# Patient Record
Sex: Male | Born: 1980 | Race: White | Hispanic: No | Marital: Married | Smoking: Current every day smoker
Health system: Southern US, Community
[De-identification: ages and names within clinical notes are randomized; demographics above are authoritative.]

---
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
## 2010-10-11 ENCOUNTER — Emergency Department (HOSPITAL_COMMUNITY): Payer: No Typology Code available for payment source

## 2010-10-11 ENCOUNTER — Emergency Department (HOSPITAL_COMMUNITY)
Admission: EM | Admit: 2010-10-11 | Discharge: 2010-10-11 | Disposition: A | Discharge status: Home / Self Care | Payer: No Typology Code available for payment source | Attending: Emergency Medicine | Admitting: Emergency Medicine

## 2010-10-11 DIAGNOSIS — F17200 Nicotine dependence, unspecified, uncomplicated: Secondary | ICD-10-CM | POA: Insufficient documentation

## 2010-10-11 DIAGNOSIS — Y92410 Unspecified street and highway as the place of occurrence of the external cause: Secondary | ICD-10-CM | POA: Insufficient documentation

## 2010-10-11 DIAGNOSIS — IMO0002 Reserved for concepts with insufficient information to code with codable children: Secondary | ICD-10-CM | POA: Insufficient documentation

## 2010-10-11 DIAGNOSIS — T07XXXA Unspecified multiple injuries, initial encounter: Secondary | ICD-10-CM

## 2010-10-11 LAB — BASIC METABOLIC PANEL
BUN: 6 mg/dL (ref 6–23)
Creatinine, Ser: 0.68 mg/dL (ref 0.50–1.35)
GFR calc non Af Amer: >60 mL/min (ref >60)
Glucose, Bld: 101 mg/dL — ABNORMAL HIGH (ref 70–99)
Potassium: 3.7 mEq/L (ref 3.5–5.1)

## 2010-10-11 LAB — CBC
HCT: 43.4 % (ref 39.0–52.0)
Hemoglobin: 14.7 g/dL (ref 13.0–17.0)
MCH: 28.6 pg (ref 26.0–34.0)
MCHC: 33.9 g/dL (ref 30.0–36.0)
MCV: 84.4 fL (ref 78.0–100.0)
RDW: 13.3 % (ref 11.5–15.5)

## 2010-10-11 MED ORDER — HYDROCODONE-ACETAMINOPHEN 5-325 MG PO TABS: 2.0000 | ORAL_TABLET | ORAL | Status: AC | PRN

## 2010-10-11 MED ORDER — HYDROMORPHONE HCL 1 MG/ML IJ SOLN
1.0000 mg | Freq: Once | INTRAMUSCULAR | Status: AC
  Administered 2010-10-11: 1 mg via INTRAVENOUS
  Filled 2010-10-11: qty 1

## 2010-10-11 MED ORDER — SODIUM CHLORIDE 0.9 % IV BOLUS (SEPSIS)
250.0000 mL | Freq: Once | INTRAVENOUS | Status: AC
  Administered 2010-10-11 (×2): 1000 mL via INTRAVENOUS

## 2010-10-11 MED ORDER — SODIUM CHLORIDE 0.9 % IV SOLN: INTRAVENOUS | Status: DC

## 2010-10-11 MED ORDER — IOHEXOL 300 MG/ML  SOLN
100.0000 mL | Freq: Once | INTRAMUSCULAR | Status: AC | PRN
  Administered 2010-10-11: 100 mL via INTRAVENOUS

## 2010-10-11 MED ORDER — IBUPROFEN 800 MG PO TABS: 800.0000 mg | ORAL_TABLET | Freq: Three times a day (TID) | ORAL | Status: AC

## 2010-10-11 NOTE — ED Notes (Signed)
Bedside report given to oncoming shift. Assuming care of pt. Denies any needs at this time. NAD. Notified MD will be in shortly to discuss test results and plan of care. Verbalized understanding. Call bell and family at [facility].

## 2010-10-11 NOTE — ED Notes (Signed)
edp in to eval 

## 2010-10-11 NOTE — ED Notes (Signed)
Pt return from ct. Waiting for result
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
## 2010-10-11 NOTE — ED Notes (Signed)
Patient is waiting for CT to be done. Patient also needs to use the restroom but will not use a urinal. Patient was told not to get up until after the CT scan was done.

## 2010-10-11 NOTE — ED Notes (Signed)
Pt waiting to be eval by edp and removed from [facility].

## 2018-09-19 ENCOUNTER — Other Ambulatory Visit: Payer: Self-pay

## 2018-09-19 ENCOUNTER — Other Ambulatory Visit: Payer: Self-pay | Admitting: Internal Medicine

## 2018-09-19 DIAGNOSIS — Z20822 Contact with and (suspected) exposure to covid-19: Secondary | ICD-10-CM

## 2018-09-25 LAB — NOVEL CORONAVIRUS, NAA: SARS-CoV-2, NAA: NOT DETECTED

## 2019-02-22 ENCOUNTER — Encounter (HOSPITAL_COMMUNITY): Payer: Self-pay | Admitting: Emergency Medicine

## 2019-02-22 ENCOUNTER — Emergency Department (HOSPITAL_COMMUNITY)
Admission: EM | Admit: 2019-02-22 | Discharge: 2019-02-22 | Disposition: A | Discharge status: Home / Self Care | Payer: BC Managed Care – PPO | Attending: Emergency Medicine | Admitting: Emergency Medicine

## 2019-02-22 ENCOUNTER — Emergency Department (HOSPITAL_COMMUNITY): Payer: BC Managed Care – PPO

## 2019-02-22 ENCOUNTER — Other Ambulatory Visit: Payer: Self-pay

## 2019-02-22 DIAGNOSIS — F17210 Nicotine dependence, cigarettes, uncomplicated: Secondary | ICD-10-CM | POA: Insufficient documentation

## 2019-02-22 DIAGNOSIS — R1031 Right lower quadrant pain: Secondary | ICD-10-CM | POA: Insufficient documentation

## 2019-02-22 LAB — COMPREHENSIVE METABOLIC PANEL
ALT: 9 U/L (ref 0–44)
AST: 17 U/L (ref 15–41)
Albumin: 4.2 g/dL (ref 3.5–5.0)
Alkaline Phosphatase: 50 U/L (ref 38–126)
Anion gap: 5 (ref 5–15)
BUN: 10 mg/dL (ref 6–20)
CO2: 26 mmol/L (ref 22–32)
Calcium: 8.7 mg/dL — ABNORMAL LOW (ref 8.9–10.3)
Chloride: 105 mmol/L (ref 98–111)
Creatinine, Ser: 0.83 mg/dL (ref 0.61–1.24)
GFR calc Af Amer: >60 mL/min (ref >60)
GFR calc non Af Amer: >60 mL/min (ref >60)
Glucose, Bld: 101 mg/dL — ABNORMAL HIGH (ref 70–99)
Potassium: 3.1 mmol/L — ABNORMAL LOW (ref 3.5–5.1)
Sodium: 136 mmol/L (ref 135–145)
Total Bilirubin: 0.4 mg/dL (ref 0.3–1.2)
Total Protein: 7.4 g/dL (ref 6.5–8.1)

## 2019-02-22 LAB — CBC
HCT: 41.8 % (ref 39.0–52.0)
Hemoglobin: 13.9 g/dL (ref 13.0–17.0)
MCH: 29.4 pg (ref 26.0–34.0)
MCHC: 33.3 g/dL (ref 30.0–36.0)
MCV: 88.4 fL (ref 80.0–100.0)
Platelets: 271 10*3/uL (ref 150–400)
RBC: 4.73 MIL/uL (ref 4.22–5.81)
RDW: 13.1 % (ref 11.5–15.5)
WBC: 8.6 10*3/uL (ref 4.0–10.5)
nRBC: 0 % (ref 0.0–0.2)

## 2019-02-22 LAB — LIPASE, BLOOD: Lipase: 39 U/L (ref 11–51)

## 2019-02-22 LAB — URINALYSIS, ROUTINE W REFLEX MICROSCOPIC
Bilirubin Urine: NEGATIVE
Glucose, UA: NEGATIVE mg/dL
Hgb urine dipstick: NEGATIVE
Ketones, ur: NEGATIVE mg/dL
Leukocytes,Ua: NEGATIVE
Nitrite: NEGATIVE
Protein, ur: NEGATIVE mg/dL
Specific Gravity, Urine: 1.018 (ref 1.005–1.030)
pH: 7 (ref 5.0–8.0)

## 2019-02-22 MED ORDER — IOHEXOL 300 MG/ML  SOLN
100.0000 mL | Freq: Once | INTRAMUSCULAR | Status: AC | PRN
  Administered 2019-02-22: 20:00:00 100 mL via INTRAVENOUS

## 2019-02-22 NOTE — ED Provider Notes (Signed)
[facility] Emergency Department Provider Note MRN:  [id number]  Arrival date & time: 02/22/19     Chief Complaint   Flank Pain   History of Present Illness   Mason Jimenez is a 38 y.o. year-old male with no pertinent past medical history presenting to the ED with chief complaint of flank pain.  3 days of persistent right flank pain.  Described as a pressure, pain originated in the right flank but is now present in the right lower quadrant.  Denies fever, no nausea, no vomiting, no chest pain, shortness of breath, no dysuria, no hematuria, no diarrhea.  Described as a pressure, mild in severity.  No exacerbating or alleviating factors.  Review of Systems  A complete 10 system review of systems was obtained and all systems are negative except as noted in the HPI and PMH.   Patient's Health History   History reviewed. No pertinent past medical history.  History reviewed. No pertinent surgical history.  Family History  Problem Relation Age of Onset  . Heart attack Mother   . Heart attack Father     Social History   Socioeconomic History  . Marital status: Married    Spouse name: Not on file  . Number of children: Not on file  . Years of education: Not on file  . Highest education level: Not on file  Occupational History  . Not on file  Social Needs  . Financial resource strain: Not on file  . Food insecurity    Worry: Not on file    Inability: Not on file  . Transportation needs    Medical: Not on file    Non-medical: Not on file  Tobacco Use  . Smoking status: Current Every Day Smoker    Packs/day: 1.00    Years: 20.00    Pack years: 20.00    Types: Cigarettes  . Smokeless tobacco: Never Used  Substance and Sexual Activity  . Alcohol use: No  . Drug use: No  . Sexual activity: Not on file  Lifestyle  . Physical activity    Days per week: Not on file    Minutes per session: Not on file  . Stress: Not on file  Relationships  .  Social Herbalist on phone: Not on file    Gets together: Not on file    Attends religious service: Not on file    Active member of club or organization: Not on file    Attends meetings of clubs or organizations: Not on file    Relationship status: Not on file  . Intimate partner violence    Fear of current or ex partner: Not on file    Emotionally abused: Not on file    Physically abused: Not on file    Forced sexual activity: Not on file  Other Topics Concern  . Not on file  Social History Narrative  . Not on file     Physical Exam  Vital Signs and Nursing Notes reviewed Vitals:   02/22/19 1637  BP: (!) 159/80  Pulse: (!) 105  Resp: 18  Temp: 98.3 F (36.8 C)    CONSTITUTIONAL: Well-appearing, NAD NEURO:  Alert and oriented x 3, no focal deficits EYES:  eyes equal and reactive ENT/NECK:  no LAD, no JVD CARDIO: Regular rate, well-perfused, normal S1 and S2 PULM:  CTAB no wheezing or rhonchi GI/GU:  normal bowel sounds, non-distended, non-tender MSK/SPINE:  No gross deformities, no edema SKIN:  no rash, atraumatic PSYCH:  Appropriate speech and behavior  Diagnostic and Interventional Summary    EKG Interpretation  Date/Time:    Ventricular Rate:    PR Interval:    QRS Duration:   QT Interval:    QTC Calculation:   R Axis:     Text Interpretation:        Labs Reviewed  URINALYSIS, ROUTINE W REFLEX MICROSCOPIC - Abnormal; Notable for the following components:      Result Value   APPearance CLOUDY (*)    All other components within normal limits  COMPREHENSIVE METABOLIC PANEL - Abnormal; Notable for the following components:   Potassium 3.1 (*)    Glucose, Bld 101 (*)    Calcium 8.7 (*)    All other components within normal limits  CBC  LIPASE, BLOOD    CT Abdomen Pelvis W Contrast  Final Result      Medications  iohexol (OMNIPAQUE) 300 MG/ML solution 100 mL (100 mLs Intravenous Contrast Given 02/22/19 1937)     Procedures  /  Critical  Care Procedures  ED Course and Medical Decision Making  I have reviewed the triage vital signs and the nursing notes.  Pertinent labs & imaging results that were available during my care of the patient were reviewed by me and considered in my medical decision making (see below for details).     Kidney stone versus appendicitis versus musculoskeletal pain, CT to evaluate.  CT is unremarkable, suspect MSK pain such as strain or sprain of the abdominal musculature.  Patient is appropriate for discharge.  Elmer Sow. Pilar Plate, MD [facility] Emergency Medicine [facility] [email or web address]  Final Clinical Impressions(s) / ED Diagnoses     ICD-10-CM   1. Right lower quadrant abdominal pain  R10.31     ED Discharge Orders    None       Discharge Instructions Discussed with and Provided to Patient:     Discharge Instructions     You were evaluated in the Emergency Department and after careful evaluation, we did not find any emergent condition requiring admission or further testing in the hospital.  Your exam/testing today is overall reassuring.  Your symptoms seem to due to strain or sprain of the abdominal muscles.  Your CT scan was normal.  As discussed, please use Tylenol or Motrin at home for discomfort.  We recommend 1 or 2 days of rest followed by a few more days of light duty at work.  Please return to the Emergency Department if you experience any worsening of your condition.  We encourage you to follow up with a primary care provider.  Thank you for allowing [place] to be a part of your care.       Sabas Sous, MD 02/22/19 2016

## 2019-02-22 NOTE — Discharge Instructions (Signed)
You were evaluated in the Emergency Department and after careful evaluation, we did not find any emergent condition requiring admission or further testing in the hospital.  Your exam/testing today is overall reassuring.  Your symptoms seem to due to strain or sprain of the abdominal muscles.  Your CT scan was normal.  As discussed, please use Tylenol or Motrin at home for discomfort.  We recommend 1 or 2 days of rest followed by a few more days of light duty at work.  Please return to the Emergency Department if you experience any worsening of your condition.  We encourage you to follow up with a primary care provider.  Thank you for allowing [place] to be a part of your care.

## 2019-02-22 NOTE — ED Triage Notes (Signed)
Patient c/o right flank pain that radiates into right lower abd. Per patient started Thursday and has progressively gotten worse. Denies any nausea, vomiting, diarrhea, fevers, or urinary symptoms. Denies hx of kidney stones. Patient reports heavy lifting with job.. Patient took Gabriel Earing Powder this morning at 10am with no relief.

## 2019-07-20 ENCOUNTER — Other Ambulatory Visit: Payer: Self-pay

## 2019-07-20 ENCOUNTER — Ambulatory Visit
Admission: EM | Admit: 2019-07-20 | Discharge: 2019-07-20 | Disposition: A | Discharge status: Home / Self Care | Payer: BC Managed Care – PPO | Attending: Emergency Medicine | Admitting: Emergency Medicine

## 2019-07-20 ENCOUNTER — Telehealth: Payer: Self-pay

## 2019-07-20 DIAGNOSIS — L0211 Cutaneous abscess of neck: Secondary | ICD-10-CM

## 2019-07-20 DIAGNOSIS — S1096XA Insect bite of unspecified part of neck, initial encounter: Secondary | ICD-10-CM

## 2019-07-20 DIAGNOSIS — W57XXXA Bitten or stung by nonvenomous insect and other nonvenomous arthropods, initial encounter: Secondary | ICD-10-CM

## 2019-07-20 MED ORDER — CEPHALEXIN 250 MG PO CAPS: 250.0000 mg | ORAL_CAPSULE | Freq: Four times a day (QID) | ORAL | 0 refills | Status: AC

## 2019-07-20 MED ORDER — CEPHALEXIN 250 MG PO CAPS: 250.0000 mg | ORAL_CAPSULE | Freq: Four times a day (QID) | ORAL | 0 refills | Status: DC

## 2019-07-20 NOTE — Discharge Instructions (Addendum)
Keflex was prescribed/take as directed Follow-up with primary care provider Continue to take OTC Tylenol/ibuprofen as needed for pain Return or go to ED for worsening of symptoms

## 2019-07-20 NOTE — ED Provider Notes (Signed)
[facility]    CSN: [id number] Arrival date & time: 07/20/19  1409      History   Chief Complaint Chief Complaint  Patient presents with  . Abscess    HPI Mason Jimenez is a 39 y.o. male.   Who presented to the urgent care with a complaint of abscess from insect bite for the past few days.  Patient is unsure what bite him.  Denies a precipitating event, noticed while changing clothes.  Localizes the bite to left side of his neck. Denies previous hx of insect bite.  Denies fever, chills, nausea, vomiting, headache, dizziness, weakness, fatigue, rash, or abdominal pain.   The history is provided by the patient. No language interpreter was used.  Abscess   History reviewed. No pertinent past medical history.  There are no problems to display for this patient.   History reviewed. No pertinent surgical history.     Home Medications    Prior to Admission medications   Medication Sig Start Date End Date Taking? Authorizing Provider  Aspirin-Acetaminophen-Caffeine (GOODY HEADACHE PO) Take 1-2 packets by mouth daily as needed (for pain).    [provider]  cephALEXin (KEFLEX) 250 MG capsule Take 1 capsule (250 mg total) by mouth 4 (four) times daily. 07/20/19   AvegnoZachery Dakins, FNP    Family History Family History  Problem Relation Age of Onset  . Heart attack Mother   . Cancer Mother   . Heart attack Father     Social History Social History   Tobacco Use  . Smoking status: Current Every Day Smoker    Packs/day: 1.00    Years: 20.00    Pack years: 20.00    Types: Cigarettes  . Smokeless tobacco: Never Used  Substance Use Topics  . Alcohol use: No  . Drug use: No     Allergies   Vicodin [hydrocodone-acetaminophen]   Review of Systems Review of Systems  Constitutional: Negative.   Respiratory: Negative.   Cardiovascular: Negative.   Skin: Positive for color change. Negative for rash.  All other systems reviewed and are  negative.    Physical Exam Triage Vital Signs ED Triage Vitals  Enc Vitals Group     BP 07/20/19 1425 120/75     Pulse Rate 07/20/19 1425 78     Resp 07/20/19 1425 18     Temp 07/20/19 1425 98.2 F (36.8 C)     Temp src --      SpO2 07/20/19 1425 97 %     Weight --      Height --      Head Circumference --      Peak Flow --      Pain Score 07/20/19 1421 3     Pain Loc --      Pain Edu? --      Excl. in GC? --    No data found.  Updated Vital Signs BP 120/75   Pulse 78   Temp 98.2 F (36.8 C)   Resp 18   SpO2 97%   Visual Acuity Right Eye Distance:   Left Eye Distance:   Bilateral Distance:    Right Eye Near:   Left Eye Near:    Bilateral Near:     Physical Exam Vitals and nursing note reviewed.  Constitutional:      General: He is not in acute distress.    Appearance: Normal appearance. He is normal weight. He is not ill-appearing, toxic-appearing or diaphoretic.  Cardiovascular:     Rate and Rhythm: Normal rate and regular rhythm.     Pulses: Normal pulses.     Heart sounds: Normal heart sounds. No murmur. No friction rub. No gallop.   Pulmonary:     Effort: Pulmonary effort is normal. No respiratory distress.     Breath sounds: Normal breath sounds. No stridor. No wheezing, rhonchi or rales.  Chest:     Chest wall: No tenderness.  Skin:    General: Skin is warm.     Coloration: Skin is not jaundiced.     Findings: Abscess and erythema present. No lesion or rash.  Neurological:     Mental Status: He is alert.      UC Treatments / Results  Labs (all labs ordered are listed, but only abnormal results are displayed) Labs Reviewed - No data to display  EKG   Radiology No results found.  Procedures Procedures (including critical care time)  Medications Ordered in UC Medications - No data to display  Initial Impression / Assessment and Plan / UC Course  I have reviewed the triage vital signs and the nursing notes.  Pertinent labs &  imaging results that were available during my care of the patient were reviewed by me and considered in my medical decision making (see chart for details).     Patient stable for discharge.  Keflex represcribed.  Was advised to follow PCP.  To return or go to ED for worsening symptoms.  Final Clinical Impressions(s) / UC Diagnoses   Final diagnoses:  Insect bite of neck, initial encounter  Cutaneous abscess of neck     Discharge Instructions     Keflex was prescribed/take as directed Follow-up with primary care provider Return or go to ED for worsening of symptoms    ED Prescriptions    Medication Sig Dispense Auth. Provider   cephALEXin (KEFLEX) 250 MG capsule Take 1 capsule (250 mg total) by mouth 4 (four) times daily. 28 capsule Nessie Nong, Darrelyn Hillock, FNP     PDMP not reviewed this encounter.   Emerson Monte, Cora 07/20/19 1458

## 2019-07-20 NOTE — ED Triage Notes (Signed)
Pt presents with abscess or insect bite on neck that happened a couple days ago

## 2019-11-10 ENCOUNTER — Ambulatory Visit
Admission: EM | Admit: 2019-11-10 | Discharge: 2019-11-10 | Disposition: A | Discharge status: Home / Self Care | Payer: BC Managed Care – PPO

## 2019-11-10 NOTE — ED Triage Notes (Signed)
RLQ pain that awakened pt from sleep last night.  Pain has been on and off since waking up this morning not in correlation with movement.

## 2019-11-10 NOTE — ED Notes (Signed)
Patient is being discharged from the Urgent Care and sent to the Emergency Department via pov . Per Doyce Para patient is in need of higher level of care due to r/o appendicitis . Patient is aware and verbalizes understanding of plan of care. There were no vitals filed for this visit.

## 2019-12-11 ENCOUNTER — Ambulatory Visit: Payer: BC Managed Care – PPO | Admitting: Family Medicine

## 2020-06-03 IMAGING — CT CT ABD-PELV W/ CM
2 of 4 series · 16 of 46 positions shown, 18 images · IV contrast (Omnipaque or Isovue)
Comparison: Report of CT abdomen pelvis dated 10/11/2010.

CLINICAL DATA: Right lower quadrant pain for 3 days.

EXAM:
CT ABDOMEN AND PELVIS WITH CONTRAST
TECHNIQUE: Multidetector CT imaging of the abdomen and pelvis was performed
using the standard protocol following bolus administration of
intravenous contrast.
CONTRAST:  100mL OMNIPAQUE IOHEXOL 300 MG/ML  SOLN

[Series 2: axial st · axial · 0.76mm/px · z∈[-689,-294]mm · 13 of 91 slices shown, 15 images]
[im 6/91  soft-tissue]
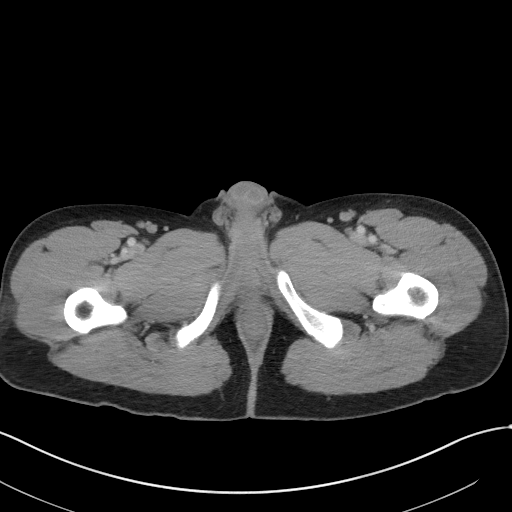
[im 6/91  bone]
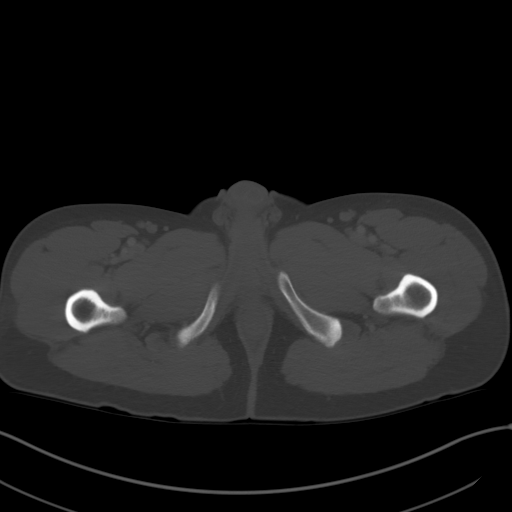
[im 11/91  soft-tissue]
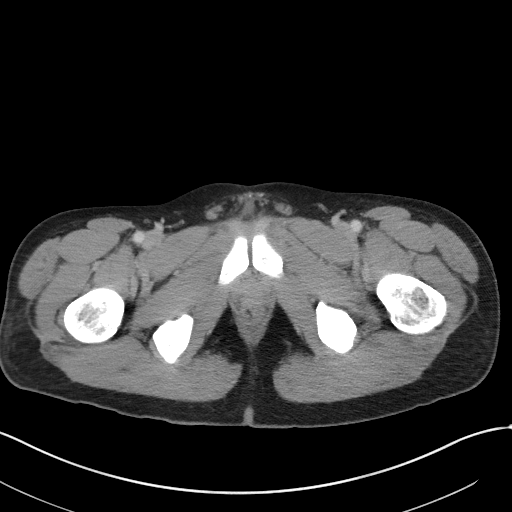
[im 22/91  soft-tissue]
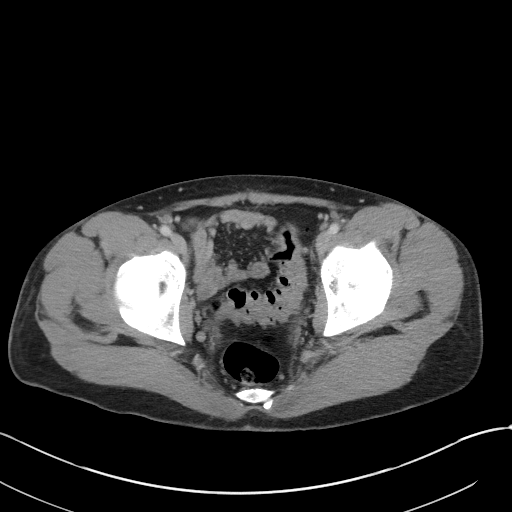
[im 27/91  soft-tissue]
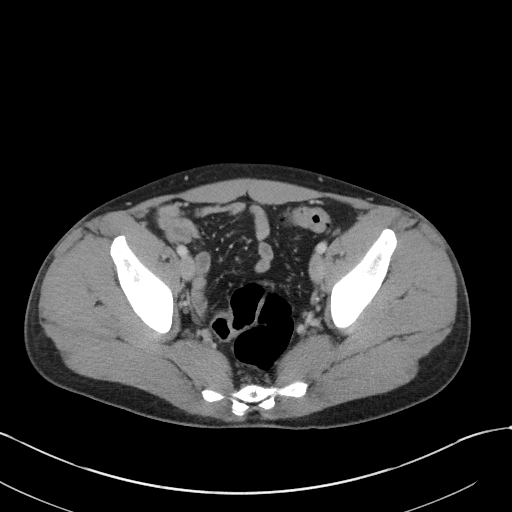
[im 32/91  soft-tissue]
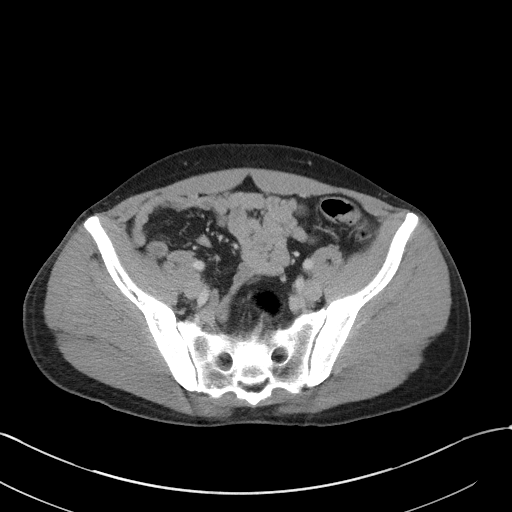
[im 38/91  soft-tissue]
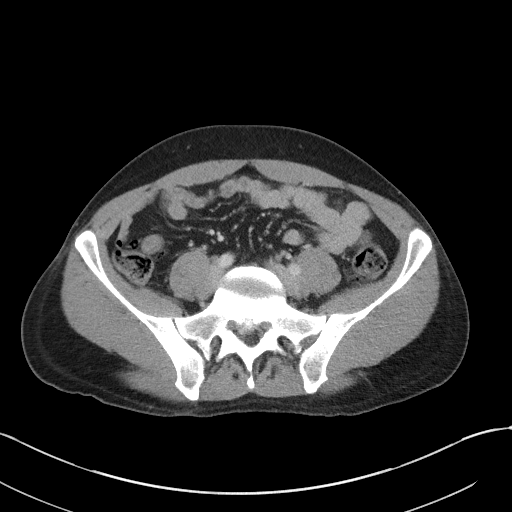
[im 48/91  soft-tissue]
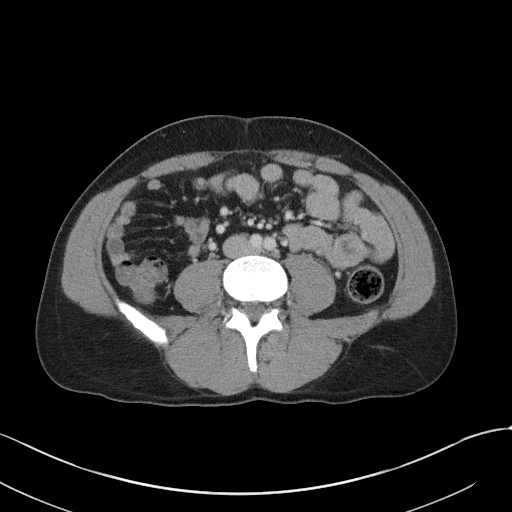
[im 53/91  soft-tissue]
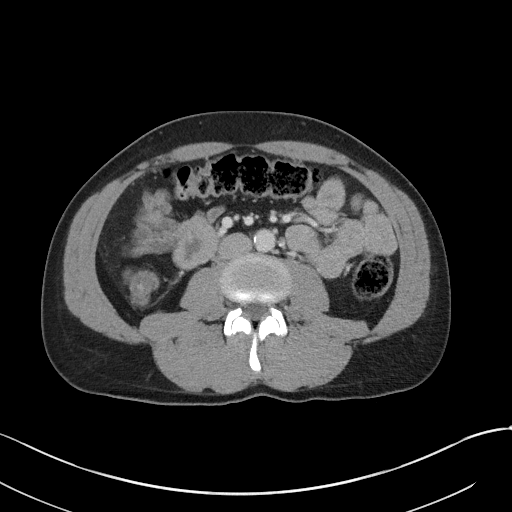
[im 59/91  soft-tissue]
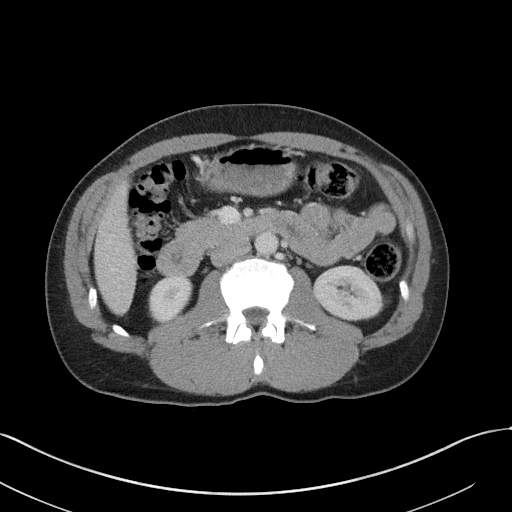
[im 59/91  bone]
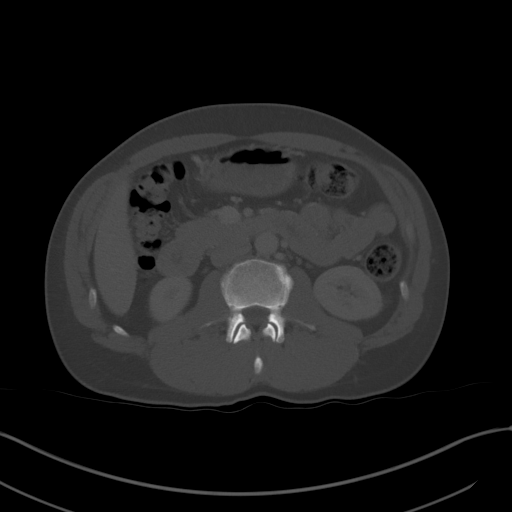
[im 64/91  soft-tissue]
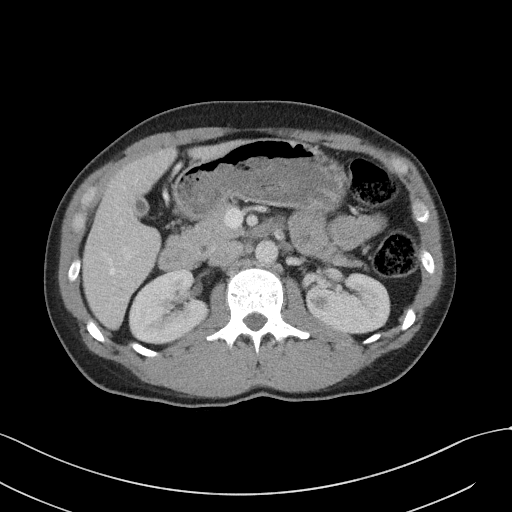
[im 69/91  soft-tissue]
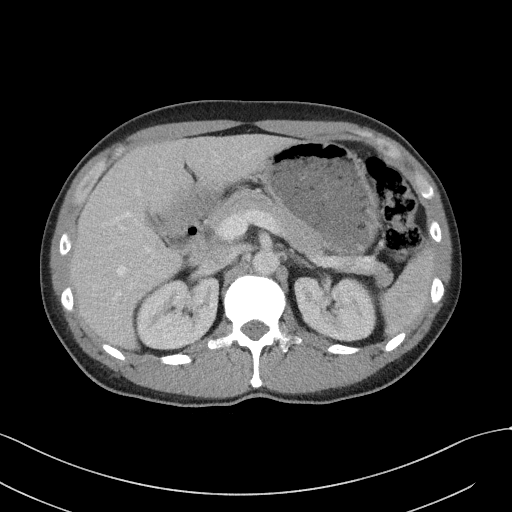
[im 80/91  soft-tissue]
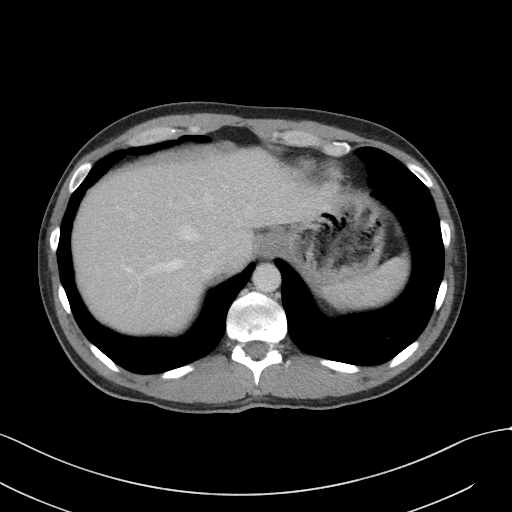
[im 85/91  soft-tissue]
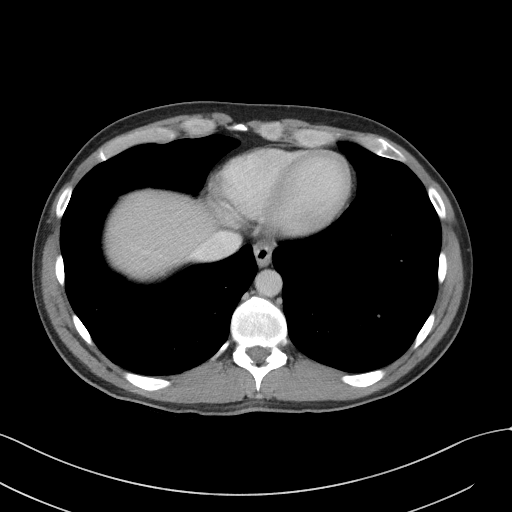

[Series 5: coronal st · coronal · 0.75mm/px · 3 of 92 slices shown]
[im 31/92  soft-tissue]
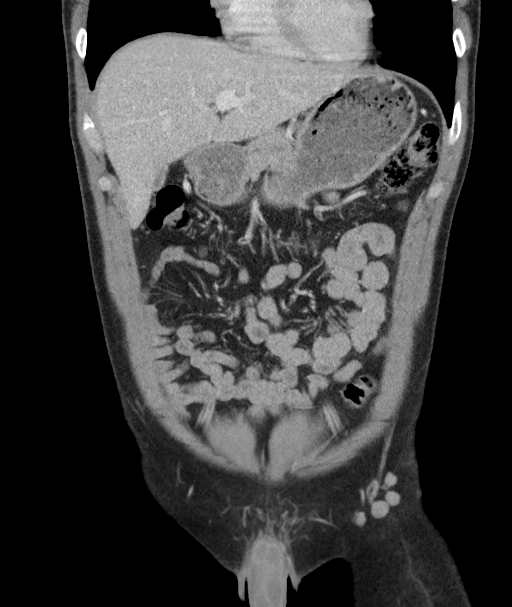
[im 41/92  soft-tissue]
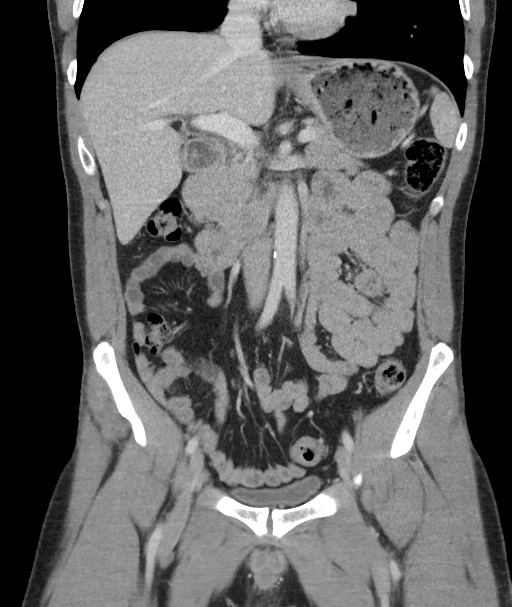
[im 51/92  soft-tissue]
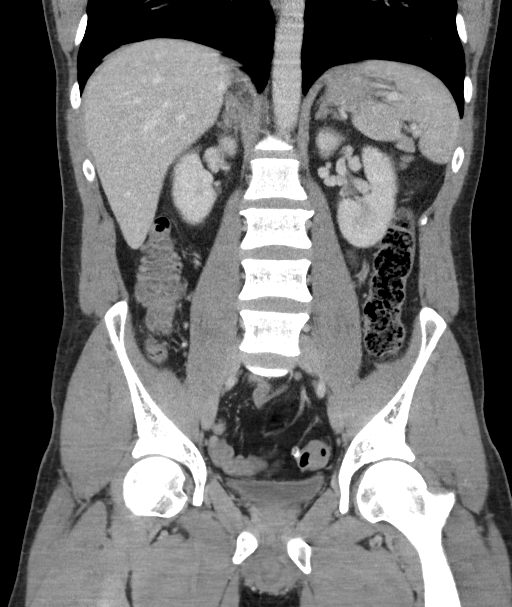

[16 of 46 positions shown; findings below may reference images not displayed]

FINDINGS: Lower chest: No acute abnormality.

Hepatobiliary: No focal liver abnormality is seen. The gallbladder
is contracted. No gallstones, gallbladder wall thickening, or
biliary dilatation.

Pancreas: Unremarkable. No pancreatic ductal dilatation or
surrounding inflammatory changes.

Spleen: Normal in size without focal abnormality.

Adrenals/Urinary Tract: Adrenal glands are unremarkable. Other than
left renal cysts measuring up to 1.3 cm, the kidneys are normal,
without renal calculi, focal lesion, or hydronephrosis. Bladder is
nondistended.

Stomach/Bowel: Stomach is within normal limits. Appendix appears
normal. There is sigmoid diverticulosis without evidence of
diverticulitis. No evidence of bowel wall thickening, distention, or
inflammatory changes.

Vascular/Lymphatic: No significant vascular findings are present. No
enlarged abdominal or pelvic lymph nodes.

Reproductive: Prostate is unremarkable. A 1.3 cm cyst in the midline
superior to the prostate and in between the seminal vesicles may be
a mllerian duct cyst or and ejaculatory duct cyst.

Other: No abdominal wall hernia or abnormality. No abdominopelvic
ascites.

Musculoskeletal: No acute or significant osseous findings.
IMPRESSION: 1. No acute findings are evident in the abdomen or pelvis.
2. Sigmoid diverticulosis.
3. Small cyst in the midline superior to the prostate may represent
a mllerian duct cyst or ejaculatory duct cyst.

## 2021-05-12 ENCOUNTER — Ambulatory Visit: Payer: Self-pay

## 2021-06-18 ENCOUNTER — Other Ambulatory Visit: Payer: Self-pay

## 2021-06-18 ENCOUNTER — Ambulatory Visit
Admission: EM | Admit: 2021-06-18 | Discharge: 2021-06-18 | Disposition: A | Discharge status: Home / Self Care | Payer: BC Managed Care – PPO | Attending: Urgent Care | Admitting: Urgent Care

## 2021-06-18 DIAGNOSIS — L0211 Cutaneous abscess of neck: Secondary | ICD-10-CM

## 2021-06-18 MED ORDER — NAPROXEN 500 MG PO TABS: 500.0000 mg | ORAL_TABLET | Freq: Two times a day (BID) | ORAL | 0 refills | Status: AC

## 2021-06-18 MED ORDER — DOXYCYCLINE HYCLATE 100 MG PO CAPS: 100.0000 mg | ORAL_CAPSULE | Freq: Two times a day (BID) | ORAL | 0 refills | Status: AC

## 2021-06-18 NOTE — ED Triage Notes (Signed)
Pt reports having a painful knot behind right ear x 3 days.  ?

## 2021-06-18 NOTE — ED Provider Notes (Signed)
?[place] ? ? ?MRN: [id number] DOB: 04-23-1980 ? ?Subjective:  ? ?Mason Jimenez is a 41 y.o. male presenting for 3-day history of persistent and worsening pain with swelling just behind his ear and radiated to the neck.  No fevers, nausea, vomiting, drainage of pus or bleeding. ? ?No current facility-administered medications for this encounter. ? ?Current Outpatient Medications:  ?  Aspirin-Acetaminophen-Caffeine (GOODY HEADACHE PO), Take 1-2 packets by mouth daily as needed (for pain)., Disp: , Rfl:  ?  cephALEXin (KEFLEX) 250 MG capsule, Take 1 capsule (250 mg total) by mouth 4 (four) times daily., Disp: 28 capsule, Rfl: 0  ? ?Allergies  ?Allergen Reactions  ? Vicodin [Hydrocodone-Acetaminophen] Nausea And Vomiting  ? ? ?History reviewed. No pertinent past medical history.  ? ?History reviewed. No pertinent surgical history. ? ?Family History  ?Problem Relation Age of Onset  ? Heart attack Mother   ? Cancer Mother   ? Heart attack Father   ? ? ?Social History  ? ?Tobacco Use  ? Smoking status: Every Day  ?  Packs/day: 1.00  ?  Years: 20.00  ?  Pack years: 20.00  ?  Types: Cigarettes  ? Smokeless tobacco: Never  ?Vaping Use  ? Vaping Use: Never used  ?Substance Use Topics  ? Alcohol use: No  ? Drug use: No  ? ? ?ROS ? ? ?Objective:  ? ?Vitals: ?BP (!) 146/80 (BP Location: Right Arm)   Pulse 94   Temp 97.9 ?F (36.6 ?C) (Oral)   Resp 18   SpO2 99%  ? ?Physical Exam ?Constitutional:   ?   General: He is not in acute distress. ?   Appearance: Normal appearance. He is well-developed and normal weight. He is not ill-appearing, toxic-appearing or diaphoretic.  ?HENT:  ?   Head: Normocephalic and atraumatic.  ?   Right Ear: External ear normal.  ?   Left Ear: External ear normal.  ?   Nose: Nose normal.  ?   Mouth/Throat:  ?   Pharynx: Oropharynx is clear.  ?Eyes:  ?   General: No scleral icterus.    ?   Right eye: No discharge.     ?   Left eye: No discharge.  ?   Extraocular Movements:  Extraocular movements intact.  ?Neck:  ? ?Cardiovascular:  ?   Rate and Rhythm: Normal rate.  ?Pulmonary:  ?   Effort: Pulmonary effort is normal.  ?Musculoskeletal:  ?   Cervical back: Normal range of motion.  ?Neurological:  ?   Mental Status: He is alert and oriented to person, place, and time.  ?Psychiatric:     ?   Mood and Affect: Mood normal.     ?   Behavior: Behavior normal.     ?   Thought Content: Thought content normal.     ?   Judgment: Judgment normal.  ? ?PROCEDURE NOTE: I&D of Abscess ?Verbal consent obtained. Local anesthesia with 2cc of 2% lidocaine with epinephrine. Site cleansed with Betadine. Incision of 1cm was made using an 11 blade, 3cc expressed consisting of a mixture of pus and serosanguinous fluid. Wound cavity was explored with curved hemostats and loculations loosened. Cleansed and dressed. ? ? ?Assessment and Plan :  ? ?PDMP not reviewed this encounter. ? ?1. Neck abscess   ? ? Successful I&D performed.  Wound care reviewed.  Start doxycycline for the abscess, naproxen for pain and inflammation. Counseled patient on potential for adverse effects with medications prescribed/recommended today, ER and  return-to-clinic precautions discussed, patient verbalized understanding. ? ?  ?Jaynee Eagles, PA-C ?06/18/21 1006 ? ?

## 2021-06-18 NOTE — Discharge Instructions (Signed)
Please change your dressing 3-5 times daily. Do not apply any ointments or creams. Each time you change your dressing, make sure that you are pressing on the wound to get pus to come out.  Try your best to have a family member help you clean gently around the perimeter of the wound with gentle soap and warm water. Pat your wound dry and let it air out if possible to make sure it is dry before reapplying another dressing.   

## 2023-06-20 ENCOUNTER — Ambulatory Visit
Admission: EM | Admit: 2023-06-20 | Discharge: 2023-06-20 | Disposition: A | Discharge status: Home / Self Care | Attending: Nurse Practitioner | Admitting: Nurse Practitioner

## 2023-06-20 DIAGNOSIS — M25512 Pain in left shoulder: Secondary | ICD-10-CM | POA: Diagnosis not present

## 2023-06-20 MED ORDER — METHOCARBAMOL 500 MG PO TABS: 500.0000 mg | ORAL_TABLET | Freq: Two times a day (BID) | ORAL | 0 refills | Status: AC

## 2023-06-20 MED ORDER — PREDNISONE 20 MG PO TABS: 40.0000 mg | ORAL_TABLET | Freq: Every day | ORAL | 0 refills | Status: AC

## 2023-06-20 NOTE — ED Triage Notes (Signed)
 Pt reports possible left shoulder injury, works at [employer] lifting and has worked on his car this week. Unsure how he may have injured his shoulder. States pain radiates into the elbow and back, has tried OTC meds tylenol and BC powders, has found no relief.

## 2023-06-20 NOTE — ED Provider Notes (Signed)
 [facility]    CSN: [id number] Arrival date & time: 06/20/23  0102      History   Chief Complaint No chief complaint on file.   HPI Mason Jimenez is a 43 y.o. male.   The history is provided by the patient.   Patient presents with left shoulder pain is been present for the past several days.  Describes the pain as a "dull ache."  Patient states that he is unsure if he injured his shoulder at work or if he was at home.  Reports that he does repetitive lifting at work, states that he lifts boxes on average that way 50 pounds on a continuous basis during his shift..  He also states that he was working on his car and had the left shoulder fixated in a certain position for an extended period of time.  He states that the pain is located in the front part of his shoulder and in the back of his shoulder.  Noticed over the past several days, pain has been radiating down into the elbow.  Also reports that he has noticed spasms in the left shoulder and in his left arm.  He denies numbness, tingling, weakness, bruising, swelling, or decreased range of motion.  Reports he has tried IcyHot, and has been exercising the shoulder with minimal relief. History reviewed. No pertinent past medical history.  There are no active problems to display for this patient.   History reviewed. No pertinent surgical history.     Home Medications    Prior to Admission medications   Medication Sig Start Date End Date Taking? Authorizing Provider  methocarbamol (ROBAXIN) 500 MG tablet Take 1 tablet (500 mg total) by mouth 2 (two) times daily. 06/20/23  Yes Leath-Warren, Sadie Haber, NP  predniSONE (DELTASONE) 20 MG tablet Take 2 tablets (40 mg total) by mouth daily with breakfast for 5 days. 06/20/23 06/25/23 Yes Leath-Warren, Sadie Haber, NP  Aspirin-Acetaminophen-Caffeine (GOODY HEADACHE PO) Take 1-2 packets by mouth daily as needed (for pain).    [provider]  cephALEXin (KEFLEX) 250 MG  capsule Take 1 capsule (250 mg total) by mouth 4 (four) times daily. 07/20/19   Avegno, Zachery Dakins, FNP  doxycycline (VIBRAMYCIN) 100 MG capsule Take 1 capsule (100 mg total) by mouth 2 (two) times daily. 06/18/21   Wallis Bamberg, PA-C  naproxen (NAPROSYN) 500 MG tablet Take 1 tablet (500 mg total) by mouth 2 (two) times daily with a meal. 06/18/21   Wallis Bamberg, PA-C    Family History Family History  Problem Relation Age of Onset   Heart attack Mother    Cancer Mother    Heart attack Father     Social History Social History   Tobacco Use   Smoking status: Every Day    Current packs/day: 1.00    Average packs/day: 1 pack/day for 20.0 years (20.0 ttl pk-yrs)    Types: Cigarettes   Smokeless tobacco: Never  Vaping Use   Vaping status: Never Used  Substance Use Topics   Alcohol use: No   Drug use: No     Allergies   Vicodin [hydrocodone-acetaminophen]   Review of Systems Review of Systems Per HPI  Physical Exam Triage Vital Signs ED Triage Vitals [06/20/23 0921]  Encounter Vitals Group     BP      Systolic BP Percentile      Diastolic BP Percentile      Pulse      Resp  Temp      Temp src      SpO2      Weight      Height      Head Circumference      Peak Flow      Pain Score 5     Pain Loc      Pain Education      Exclude from Growth Chart    No data found.  Updated Vital Signs There were no vitals taken for this visit.  Visual Acuity Right Eye Distance:   Left Eye Distance:   Bilateral Distance:    Right Eye Near:   Left Eye Near:    Bilateral Near:     Physical Exam Vitals and nursing note reviewed.  Constitutional:      General: He is not in acute distress.    Appearance: Normal appearance.  HENT:     Head: Normocephalic.  Eyes:     Extraocular Movements: Extraocular movements intact.     Pupils: Pupils are equal, round, and reactive to light.  Pulmonary:     Effort: Pulmonary effort is normal.  Musculoskeletal:     Left shoulder:  No swelling, deformity or tenderness. Normal range of motion. Normal strength. Normal pulse.     Left upper arm: Normal. No swelling, deformity or tenderness.     Left elbow: Normal. No swelling or deformity. Normal range of motion. No tenderness.     Cervical back: Normal range of motion. No edema, erythema or rigidity. Pain with movement (With right lateral rotation) present. No spinous process tenderness or muscular tenderness. Normal range of motion.  Skin:    General: Skin is warm and dry.  Neurological:     General: No focal deficit present.     Mental Status: He is alert and oriented to person, place, and time.  Psychiatric:        Mood and Affect: Mood normal.        Behavior: Behavior normal.      UC Treatments / Results  Labs (all labs ordered are listed, but only abnormal results are displayed) Labs Reviewed - No data to display  EKG   Radiology No results found.  Procedures Procedures (including critical care time)  Medications Ordered in UC Medications - No data to display  Initial Impression / Assessment and Plan / UC Course  I have reviewed the triage vital signs and the nursing notes.  Pertinent labs & imaging results that were available during my care of the patient were reviewed by me and considered in my medical decision making (see chart for details).  Difficult to determine the cause of the patient's left shoulder pain.  Unable to perform imaging in this location, patient declined being referred to hospital for imaging.  On exam, he has full range of motion with no tenderness noted in the left shoulder, left upper arm, left elbow, or neck.  He is unsure of any injuries.  Differential diagnoses include left shoulder strain, tendinitis, or nerve impingement.  Will treat empirically with prednisone 40 mg for the next 5 days to treat inflammation, and for spasm, will treat with methocarbamol 500 mg.  Supportive care recommendations were provided and discussed  with the patient to include keeping the joint active, RICE therapy, and over-the-counter analgesics.  Discussed indications regarding follow-up.  Patient was in agreement with this plan of care and verbalized understanding.  All questions were answered.  Patient stable for discharge.  Work note was provided.  Final Clinical Impressions(s) / UC Diagnoses   Final diagnoses:  Left shoulder pain, unspecified chronicity     Discharge Instructions      Take medication as prescribed. Recommend staying active to help keep the joint mobile.  I provided exercises for you to perform at least 2-3 times daily. RICE therapy, rest, ice, compression, and elevation.  Apply ice for 20 minutes, remove for 1 hour, repeat as needed.  You can also apply heat in the same manner. Take over-the-counter Tylenol or ibuprofen as needed for pain, fever, or general discomfort.  Do not take ibuprofen when you are taking the prednisone. As discussed, if symptoms fail to improve with this treatment, it is recommended that you follow-up with orthopedics for further evaluation. Follow-up as needed.     ED Prescriptions     Medication Sig Dispense Auth. Provider   predniSONE (DELTASONE) 20 MG tablet Take 2 tablets (40 mg total) by mouth daily with breakfast for 5 days. 10 tablet Leath-Warren, Sadie Haber, NP   methocarbamol (ROBAXIN) 500 MG tablet Take 1 tablet (500 mg total) by mouth 2 (two) times daily. 20 tablet Leath-Warren, Sadie Haber, NP      PDMP not reviewed this encounter.   Abran Cantor, NP 06/20/23 1005

## 2023-06-20 NOTE — Discharge Instructions (Signed)
 Take medication as prescribed. Recommend staying active to help keep the joint mobile.  I provided exercises for you to perform at least 2-3 times daily. RICE therapy, rest, ice, compression, and elevation.  Apply ice for 20 minutes, remove for 1 hour, repeat as needed.  You can also apply heat in the same manner. Take over-the-counter Tylenol or ibuprofen as needed for pain, fever, or general discomfort.  Do not take ibuprofen when you are taking the prednisone. As discussed, if symptoms fail to improve with this treatment, it is recommended that you follow-up with orthopedics for further evaluation. Follow-up as needed.
